# Patient Record
Sex: Male | Born: 1955 | Race: White | Hispanic: No | Marital: Married | Smoking: Former smoker
Health system: Southern US, Community
[De-identification: ages and names within clinical notes are randomized; demographics above are authoritative.]

## PROBLEM LIST (undated history)

## (undated) DIAGNOSIS — E079 Disorder of thyroid, unspecified: Secondary | ICD-10-CM

## (undated) DIAGNOSIS — E7800 Pure hypercholesterolemia, unspecified: Secondary | ICD-10-CM

## (undated) DIAGNOSIS — M069 Rheumatoid arthritis, unspecified: Secondary | ICD-10-CM

## (undated) DIAGNOSIS — Z87898 Personal history of other specified conditions: Secondary | ICD-10-CM

## (undated) DIAGNOSIS — I10 Essential (primary) hypertension: Secondary | ICD-10-CM

## (undated) DIAGNOSIS — E119 Type 2 diabetes mellitus without complications: Secondary | ICD-10-CM

## (undated) HISTORY — PX: CHOLECYSTECTOMY: SHX55

---

## 2016-10-03 ENCOUNTER — Emergency Department (HOSPITAL_COMMUNITY): Payer: Medicare Other

## 2016-10-03 ENCOUNTER — Observation Stay (HOSPITAL_COMMUNITY)
Admission: EM | Admit: 2016-10-03 | Discharge: 2016-10-04 | Disposition: A | Discharge status: Home / Self Care | Payer: Medicare Other | Attending: Internal Medicine | Admitting: Internal Medicine

## 2016-10-03 ENCOUNTER — Encounter (HOSPITAL_COMMUNITY): Payer: Self-pay | Admitting: Emergency Medicine

## 2016-10-03 DIAGNOSIS — R0789 Other chest pain: Secondary | ICD-10-CM | POA: Diagnosis not present

## 2016-10-03 DIAGNOSIS — Z79899 Other long term (current) drug therapy: Secondary | ICD-10-CM | POA: Insufficient documentation

## 2016-10-03 DIAGNOSIS — E7800 Pure hypercholesterolemia, unspecified: Secondary | ICD-10-CM | POA: Diagnosis not present

## 2016-10-03 DIAGNOSIS — G40909 Epilepsy, unspecified, not intractable, without status epilepticus: Secondary | ICD-10-CM | POA: Diagnosis not present

## 2016-10-03 DIAGNOSIS — I4581 Long QT syndrome: Secondary | ICD-10-CM | POA: Diagnosis not present

## 2016-10-03 DIAGNOSIS — Z87891 Personal history of nicotine dependence: Secondary | ICD-10-CM | POA: Diagnosis not present

## 2016-10-03 DIAGNOSIS — Z880 Allergy status to penicillin: Secondary | ICD-10-CM | POA: Insufficient documentation

## 2016-10-03 DIAGNOSIS — I10 Essential (primary) hypertension: Secondary | ICD-10-CM | POA: Diagnosis not present

## 2016-10-03 DIAGNOSIS — Z791 Long term (current) use of non-steroidal anti-inflammatories (NSAID): Secondary | ICD-10-CM | POA: Diagnosis not present

## 2016-10-03 DIAGNOSIS — E079 Disorder of thyroid, unspecified: Secondary | ICD-10-CM

## 2016-10-03 DIAGNOSIS — R079 Chest pain, unspecified: Secondary | ICD-10-CM | POA: Diagnosis present

## 2016-10-03 DIAGNOSIS — Z8249 Family history of ischemic heart disease and other diseases of the circulatory system: Secondary | ICD-10-CM | POA: Insufficient documentation

## 2016-10-03 DIAGNOSIS — Z87898 Personal history of other specified conditions: Secondary | ICD-10-CM

## 2016-10-03 DIAGNOSIS — M069 Rheumatoid arthritis, unspecified: Secondary | ICD-10-CM

## 2016-10-03 DIAGNOSIS — Z7982 Long term (current) use of aspirin: Secondary | ICD-10-CM | POA: Diagnosis not present

## 2016-10-03 DIAGNOSIS — E785 Hyperlipidemia, unspecified: Secondary | ICD-10-CM | POA: Diagnosis not present

## 2016-10-03 DIAGNOSIS — R7303 Prediabetes: Secondary | ICD-10-CM | POA: Diagnosis not present

## 2016-10-03 DIAGNOSIS — R0602 Shortness of breath: Secondary | ICD-10-CM

## 2016-10-03 HISTORY — DX: Pure hypercholesterolemia, unspecified: E78.00

## 2016-10-03 HISTORY — DX: Personal history of other specified conditions: Z87.898

## 2016-10-03 HISTORY — DX: Essential (primary) hypertension: I10

## 2016-10-03 HISTORY — DX: Rheumatoid arthritis, unspecified: M06.9

## 2016-10-03 HISTORY — DX: Type 2 diabetes mellitus without complications: E11.9

## 2016-10-03 HISTORY — DX: Disorder of thyroid, unspecified: E07.9

## 2016-10-03 LAB — COMPREHENSIVE METABOLIC PANEL
ALT: 45 U/L (ref 17–63)
AST: 39 U/L (ref 15–41)
Albumin: 4.5 g/dL (ref 3.5–5.0)
Alkaline Phosphatase: 113 U/L (ref 38–126)
Anion gap: 12 (ref 5–15)
BILIRUBIN TOTAL: 0.7 mg/dL (ref 0.3–1.2)
BUN: 15 mg/dL (ref 6–20)
CHLORIDE: 100 mmol/L — AB (ref 101–111)
CO2: 23 mmol/L (ref 22–32)
CREATININE: 1.37 mg/dL — AB (ref 0.61–1.24)
Calcium: 9.4 mg/dL (ref 8.9–10.3)
GFR, EST NON AFRICAN AMERICAN: 53 mL/min — AB (ref >60)
Glucose, Bld: 125 mg/dL — ABNORMAL HIGH (ref 65–99)
Potassium: 3.9 mmol/L (ref 3.5–5.1)
Sodium: 135 mmol/L (ref 135–145)
TOTAL PROTEIN: 7.2 g/dL (ref 6.5–8.1)

## 2016-10-03 LAB — CBC WITH DIFFERENTIAL/PLATELET
Basophils Absolute: 0 10*3/uL (ref 0.0–0.1)
Basophils Relative: 0 %
EOS PCT: 2 %
Eosinophils Absolute: 0.2 10*3/uL (ref 0.0–0.7)
HEMATOCRIT: 45.3 % (ref 39.0–52.0)
Hemoglobin: 16 g/dL (ref 13.0–17.0)
LYMPHS ABS: 3.8 10*3/uL (ref 0.7–4.0)
LYMPHS PCT: 33 %
MCH: 34.1 pg — AB (ref 26.0–34.0)
MCHC: 35.3 g/dL (ref 30.0–36.0)
MCV: 96.6 fL (ref 78.0–100.0)
MONO ABS: 0.9 10*3/uL (ref 0.1–1.0)
Monocytes Relative: 8 %
Neutro Abs: 6.5 10*3/uL (ref 1.7–7.7)
Neutrophils Relative %: 57 %
PLATELETS: 179 10*3/uL (ref 150–400)
RBC: 4.69 MIL/uL (ref 4.22–5.81)
RDW: 12.4 % (ref 11.5–15.5)
WBC: 11.5 10*3/uL — ABNORMAL HIGH (ref 4.0–10.5)

## 2016-10-03 LAB — TROPONIN I

## 2016-10-03 LAB — BRAIN NATRIURETIC PEPTIDE: B Natriuretic Peptide: 16.3 pg/mL (ref 0.0–100.0)

## 2016-10-03 LAB — I-STAT TROPONIN, ED: TROPONIN I, POC: 0 ng/mL (ref 0.00–0.08)

## 2016-10-03 MED ORDER — IOPAMIDOL (ISOVUE-370) INJECTION 76%
INTRAVENOUS | Status: AC
  Administered 2016-10-03: 100 mL
  Filled 2016-10-03: qty 100

## 2016-10-03 MED ORDER — TRAMADOL HCL 50 MG PO TABS
50.0000 mg | ORAL_TABLET | Freq: Two times a day (BID) | ORAL | Status: DC
  Administered 2016-10-03 – 2016-10-04 (×3): 50 mg via ORAL
  Filled 2016-10-03 (×3): qty 1

## 2016-10-03 MED ORDER — IPRATROPIUM-ALBUTEROL 0.5-2.5 (3) MG/3ML IN SOLN
3.0000 mL | Freq: Once | RESPIRATORY_TRACT | Status: AC
  Administered 2016-10-03: 3 mL via RESPIRATORY_TRACT
  Filled 2016-10-03: qty 3

## 2016-10-03 MED ORDER — CARBAMAZEPINE 100 MG PO CHEW
300.0000 mg | CHEWABLE_TABLET | Freq: Two times a day (BID) | ORAL | Status: DC
  Administered 2016-10-03 – 2016-10-04 (×3): 300 mg via ORAL
  Filled 2016-10-03 (×4): qty 3

## 2016-10-03 MED ORDER — LEVOTHYROXINE SODIUM 25 MCG PO TABS
25.0000 ug | ORAL_TABLET | ORAL | Status: DC
  Administered 2016-10-03 – 2016-10-04 (×2): 25 ug via ORAL
  Filled 2016-10-03 (×2): qty 1

## 2016-10-03 MED ORDER — ENOXAPARIN SODIUM 40 MG/0.4ML ~~LOC~~ SOLN
40.0000 mg | SUBCUTANEOUS | Status: DC
  Administered 2016-10-03: 40 mg via SUBCUTANEOUS
  Filled 2016-10-03: qty 0.4

## 2016-10-03 MED ORDER — ASPIRIN 81 MG PO CHEW
324.0000 mg | CHEWABLE_TABLET | Freq: Once | ORAL | Status: AC
  Administered 2016-10-03: 324 mg via ORAL
  Filled 2016-10-03: qty 4

## 2016-10-03 MED ORDER — NITROGLYCERIN 2 % TD OINT
1.0000 [in_us] | TOPICAL_OINTMENT | Freq: Once | TRANSDERMAL | Status: AC
  Administered 2016-10-03: 1 [in_us] via TOPICAL
  Filled 2016-10-03: qty 1

## 2016-10-03 MED ORDER — CARVEDILOL 6.25 MG PO TABS
6.2500 mg | ORAL_TABLET | Freq: Two times a day (BID) | ORAL | Status: DC
  Administered 2016-10-03 – 2016-10-04 (×3): 6.25 mg via ORAL
  Filled 2016-10-03 (×3): qty 1

## 2016-10-03 MED ORDER — HYDROXYCHLOROQUINE SULFATE 200 MG PO TABS
200.0000 mg | ORAL_TABLET | Freq: Two times a day (BID) | ORAL | Status: DC
  Administered 2016-10-03 – 2016-10-04 (×3): 200 mg via ORAL
  Filled 2016-10-03 (×4): qty 1

## 2016-10-03 MED ORDER — ACETAMINOPHEN 325 MG PO TABS: 650.0000 mg | ORAL_TABLET | Freq: Four times a day (QID) | ORAL | Status: DC | PRN

## 2016-10-03 MED ORDER — PREDNISONE 20 MG PO TABS
60.0000 mg | ORAL_TABLET | Freq: Once | ORAL | Status: AC
  Administered 2016-10-03: 60 mg via ORAL
  Filled 2016-10-03: qty 3

## 2016-10-03 MED ORDER — ASPIRIN EC 81 MG PO TBEC
81.0000 mg | DELAYED_RELEASE_TABLET | Freq: Every day | ORAL | Status: DC
  Administered 2016-10-04: 81 mg via ORAL
  Filled 2016-10-03: qty 1

## 2016-10-03 MED ORDER — LORATADINE 10 MG PO TABS
10.0000 mg | ORAL_TABLET | Freq: Every day | ORAL | Status: DC
  Administered 2016-10-03 – 2016-10-04 (×2): 10 mg via ORAL
  Filled 2016-10-03 (×2): qty 1

## 2016-10-03 MED ORDER — ACETAMINOPHEN 650 MG RE SUPP: 650.0000 mg | Freq: Four times a day (QID) | RECTAL | Status: DC | PRN

## 2016-10-03 MED ORDER — ATORVASTATIN CALCIUM 40 MG PO TABS
40.0000 mg | ORAL_TABLET | Freq: Every day | ORAL | Status: DC
  Administered 2016-10-03: 40 mg via ORAL
  Filled 2016-10-03: qty 1

## 2016-10-03 MED ORDER — SODIUM CHLORIDE 0.9% FLUSH
3.0000 mL | Freq: Two times a day (BID) | INTRAVENOUS | Status: DC
  Administered 2016-10-03 – 2016-10-04 (×2): 3 mL via INTRAVENOUS

## 2016-10-03 NOTE — ED Notes (Signed)
Patient transported to CT 

## 2016-10-03 NOTE — ED Provider Notes (Signed)
[facility]PT Provider Note   CSN: [id number] Arrival date & time: 10/03/16  0301     History   Chief Complaint Chief Complaint  Patient presents with  . Shortness of Breath    HPI Joseph Long is a 61 y.o. male.  The history is provided by the patient.  Shortness of Breath  This is a recurrent problem. The average episode lasts 6 hours. The problem occurs continuously.The current episode started 6 to 12 hours ago. The problem has been gradually improving. Associated symptoms include rhinorrhea, cough, orthopnea and chest pain. Pertinent negatives include no fever, no sore throat, no sputum production, no hemoptysis and no vomiting. It is unknown what precipitated the problem. He has tried nothing for the symptoms. Associated medical issues do not include pneumonia, PE, heart failure or past MI.  Chest Pain   This is a recurrent problem. The current episode started 3 to 5 hours ago. The problem occurs constantly. The problem has not changed since onset.The pain is present in the substernal region. The pain is mild. The quality of the pain is described as pressure-like and dull. The pain radiates to the left shoulder. Associated symptoms include cough, orthopnea and shortness of breath. Pertinent negatives include no fever, no hemoptysis, no sputum production and no vomiting. Risk factors include smoking/tobacco exposure, being elderly and male gender.  His past medical history is significant for diabetes and hypertension.  Pertinent negatives for past medical history include no MI and no PE.   Had cardiac work up last year including negative stress testing in [facility] in [place], [place].   Past Medical History:  Diagnosis Date  . Diabetes mellitus without complication (HCC)   . History of seizures   . Hypercholesterolemia   . Hypertension   . Rheumatoid arthritis (HCC)   . Thyroid disease     Patient Active Problem List   Diagnosis Date Noted  . Chest pain 10/03/2016   . History of seizures 10/03/2016  . Hypercholesterolemia 10/03/2016  . Hypertension 10/03/2016  . Rheumatoid arthritis (HCC) 10/03/2016  . Thyroid disease 10/03/2016  . Pre-diabetes 10/03/2016    Past Surgical History:  Procedure Laterality Date  . CHOLECYSTECTOMY         Home Medications    Prior to Admission medications   Medication Sig Start Date End Date Taking? Authorizing Provider  aspirin EC 81 MG tablet Take 81 mg by mouth daily.   Yes [provider]  atorvastatin (LIPITOR) 40 MG tablet Take 40 mg by mouth at bedtime. 09/21/16  Yes [provider]  carbamazepine (TEGRETOL) 100 MG chewable tablet Chew 300 mg by mouth 2 (two) times daily. 09/15/16  Yes [provider]  carvedilol (COREG) 6.25 MG tablet Take 6.25 mg by mouth 2 (two) times daily. 07/06/16  Yes [provider]  Cholecalciferol (D3-1000 PO) Take 2,000 Units by mouth every morning.   Yes [provider]  folic acid (FOLVITE) 800 MCG tablet Take 800 mcg by mouth every morning.   Yes [provider]  hydroxychloroquine (PLAQUENIL) 200 MG tablet Take 200 mg by mouth 2 (two) times daily. 09/15/16  Yes [provider]  latanoprost (XALATAN) 0.005 % ophthalmic solution Place 1 drop into both eyes daily. 07/03/16  Yes [provider]  levothyroxine (SYNTHROID, LEVOTHROID) 25 MCG tablet Take 25 mcg by mouth every morning. 09/15/16  Yes [provider]  loratadine (CLARITIN) 10 MG tablet Take 10 mg by mouth daily.   Yes [provider]  meloxicam (  MOBIC) 15 MG tablet Take 15 mg by mouth daily. 09/17/16  Yes [provider]  Multiple Vitamin (MULTIVITAMIN WITH MINERALS) TABS tablet Take 1 tablet by mouth every morning.   Yes [provider]  Omega-3 Fatty Acids (FISH OIL) 1000 MG CAPS Take 1,000 mg by mouth 2 (two) times daily.   Yes [provider]  traMADol (ULTRAM) 50 MG tablet Take 50 mg by mouth 2 (two) times  daily. 07/06/16  Yes [provider]    Family History Family History  Problem Relation Age of Onset  . COPD Mother   . Hyperlipidemia Father   . Heart attack Father   . Colon cancer Sister   . Diabetes Maternal Grandmother     Social History Social History  Substance Use Topics  . Smoking status: Former Smoker    Packs/day: 1.00    Years: 20.00  . Smokeless tobacco: Never Used     Comment: 1 PPD for 20 years  . Alcohol use Yes     Comment: Occasional     Allergies   Penicillins   Review of Systems Review of Systems  Constitutional: Negative for fever.  HENT: Positive for rhinorrhea. Negative for sore throat.   Respiratory: Positive for cough and shortness of breath. Negative for hemoptysis and sputum production.   Cardiovascular: Positive for chest pain and orthopnea.  Gastrointestinal: Negative for vomiting.  All other systems are reviewed and are negative for acute change except as noted in the HPI    Physical Exam Updated Vital Signs BP 135/79   Pulse 68   Temp 98.1 F (36.7 C)   Resp 16   Ht 5\' 4"  (1.626 m)   Wt 84.8 kg (187 lb)   SpO2 100%   BMI 32.10 kg/m   Physical Exam  Constitutional: He is oriented to person, place, and time. He appears well-developed and well-nourished. No distress.  HENT:  Head: Normocephalic and atraumatic.  Nose: Nose normal.  Eyes: Pupils are equal, round, and reactive to light. Conjunctivae and EOM are normal. Right eye exhibits no discharge. Left eye exhibits no discharge. No scleral icterus.  Neck: Normal range of motion. Neck supple.  Cardiovascular: Normal rate and regular rhythm.  Exam reveals no gallop and no friction rub.   No murmur heard. Pulmonary/Chest: Effort normal and breath sounds normal. No stridor. No respiratory distress. He has no rales.  Abdominal: Soft. He exhibits no distension. There is no tenderness.  Musculoskeletal: He exhibits no edema or tenderness.  NO SWELLING OR EDEMA    Neurological: He is alert and oriented to person, place, and time.  Skin: Skin is warm and dry. No rash noted. He is not diaphoretic. No erythema.  Psychiatric: He has a normal mood and affect.  Vitals reviewed.    ED Treatments / Results  Labs (all labs ordered are listed, but only abnormal results are displayed) Labs Reviewed  CBC WITH DIFFERENTIAL/PLATELET - Abnormal; Notable for the following:       Result Value   WBC 11.5 (*)    MCH 34.1 (*)    All other components within normal limits  COMPREHENSIVE METABOLIC PANEL - Abnormal; Notable for the following:    Chloride 100 (*)    Glucose, Bld 125 (*)    Creatinine, Ser 1.37 (*)    GFR calc non Af Amer 53 (*)    All other components within normal limits  BRAIN NATRIURETIC PEPTIDE  TROPONIN I  HIV ANTIBODY (ROUTINE TESTING)  TROPONIN I  HEMOGLOBIN A1C  BASIC METABOLIC PANEL  CBC  I-STAT TROPONIN, ED    EKG  EKG Interpretation  Date/Time:  Friday October 03 2016 03:09:02 EDT Ventricular Rate:  73 PR Interval:  182 QRS Duration: 92 QT Interval:  440 QTC Calculation: 484 R Axis:   22 Text Interpretation:  Sinus rhythm with occasional Premature ventricular complexes Nonspecific ST abnormality Prolonged QT Abnormal ECG NO STEMI No old tracing to compare Confirmed by Drema Pry [phone]) on 10/03/2016 8:20:45 AM       Radiology Dg Chest 2 View  Result Date: 10/03/2016 CLINICAL DATA:  Shortness of breath tonight, chest discomfort. EXAM: CHEST  2 VIEW COMPARISON:  None. FINDINGS: Cardiomediastinal silhouette is normal . Mild interstitial prominence. Increased lung volumes with flattened hemidiaphragms. No pleural effusion or focal consolidation. Linear density LEFT lung base. No pneumothorax. Soft tissue planes and included osseous structures are nonsuspicious. Surgical clips in the included right abdomen compatible with cholecystectomy. Mild degenerative change of the spine. IMPRESSION: COPD.  LEFT lung base  atelectasis/scarring. Electronically Signed   By: Awilda Metro M.D.   On: 10/03/2016 03:58   Ct Angio Chest Pe W And/or Wo Contrast  Result Date: 10/03/2016 CLINICAL DATA:  61 year old male with acute onset mid chest pain/ tightness this morning. EXAM: CT ANGIOGRAPHY CHEST WITH CONTRAST TECHNIQUE: Multidetector CT imaging of the chest was performed using the standard protocol during bolus administration of intravenous contrast. Multiplanar CT image reconstructions and MIPs were obtained to evaluate the vascular anatomy. CONTRAST:  100 mL Isovue 370. COMPARISON:  None. FINDINGS: Cardiovascular: Satisfactory opacification of the pulmonary arteries to the segmental level. No evidence of pulmonary embolism. Normal heart size. No pericardial effusion. Mediastinum/Nodes: No enlarged mediastinal, hilar, or axillary lymph nodes. Thyroid gland, trachea, and esophagus demonstrate no significant findings. Lungs/Pleura: Lungs are clear. There is minimal dependent atelectasis. No pleural effusion or pneumothorax. Upper Abdomen: No acute abnormality. Patient is status post cholecystectomy. Partially visualized right renal cyst demonstrates fluid attenuation. Musculoskeletal: No chest wall abnormality. No acute or significant osseous findings. There are degenerative changes of the thoracolumbar spine with anterior osteophytosis. Review of the MIP images confirms the above findings. IMPRESSION: No CT evidence for pulmonary embolism or other acute intrathoracic findings to explain the patient's clinical symptoms. Electronically Signed   By: Sande Brothers M.D.   On: 10/03/2016 10:33    Procedures Procedures (including critical care time)  Medications Ordered in ED Medications  enoxaparin (LOVENOX) injection 40 mg (not administered)  sodium chloride flush (NS) 0.9 % injection 3 mL (3 mLs Intravenous Not Given 10/03/16 1215)  acetaminophen (TYLENOL) tablet 650 mg (not administered)    Or  acetaminophen (TYLENOL)  suppository 650 mg (not administered)  aspirin EC tablet 81 mg (not administered)  atorvastatin (LIPITOR) tablet 40 mg (not administered)  carbamazepine (TEGRETOL) chewable tablet 300 mg (300 mg Oral Given 10/03/16 1318)  carvedilol (COREG) tablet 6.25 mg (6.25 mg Oral Given 10/03/16 1317)  hydroxychloroquine (PLAQUENIL) tablet 200 mg (200 mg Oral Given 10/03/16 1319)  levothyroxine (SYNTHROID, LEVOTHROID) tablet 25 mcg (not administered)  loratadine (CLARITIN) tablet 10 mg (10 mg Oral Given 10/03/16 1317)  traMADol (ULTRAM) tablet 50 mg (50 mg Oral Given 10/03/16 1318)  ipratropium-albuterol (DUONEB) 0.5-2.5 (3) MG/3ML nebulizer solution 3 mL (3 mLs Nebulization Given 10/03/16 0905)  aspirin chewable tablet 324 mg (324 mg Oral Given 10/03/16 0932)  nitroGLYCERIN (NITROGLYN) 2 % ointment 1 inch (1 inch Topical Given 10/03/16 0932)  iopamidol (ISOVUE-370) 76 % injection (100 mLs  Contrast Given 10/03/16 1017)  ipratropium-albuterol (DUONEB) 0.5-2.5 (3) MG/3ML nebulizer solution 3 mL (3 mLs Nebulization Given 10/03/16 1122)  predniSONE (DELTASONE) tablet 60 mg (60 mg Oral Given 10/03/16 1151)     Initial Impression / Assessment and Plan / ED Course  I have reviewed the triage vital signs and the nursing notes.  Pertinent labs & imaging results that were available during my care of the patient were reviewed by me and considered in my medical decision making (see chart for details).     Atypical chest pain. EKG with nonspecific changes. No prior EKG for comparison. Initial troponin negative. Chest pain improved with nitroglycerin. Patient has a HEART >3 and will require admission for ACS rule out and the rest of the workup is negative.  Given the patient's recent travel and focal atelectasis noted on chest x-ray, CTA was obtained ruling out PE, pneumonia.  Presentation was not classic for aortic dissection or esophageal perforation.  Case is discussed with medicine who admitted the patient for  further workup and management.  Final Clinical Impressions(s) / ED Diagnoses   Final diagnoses:  Shortness of breath  Atypical chest pain      Clancy Leiner, Amadeo Garnet, MD 10/03/16 1722

## 2016-10-03 NOTE — ED Triage Notes (Signed)
Patient reports SOB with occasional dry cough this morning , denies fever or chills .

## 2016-10-03 NOTE — H&P (Signed)
Date: 10/03/2016               Patient Name:  Joseph Long MRN: [id number]  DOB: 1955-09-04 Age / Sex: 61 y.o., male   PCP: Dr. Renaldo Fiddler. [facility], [place]         Medical Service: Internal Medicine Teaching Service         Attending Physician: Dr. Doneen Poisson, MD    First Contact: Dr. Minda Meo Pager: [phone]  Second Contact: Dr. Earlene Plater Pager: [phone]       After Hours (After 5p/  First Contact Pager: [phone]  weekends / holidays): Second Contact Pager: [phone]   Chief Complaint: Chest Pain and shortness of breath  History of Present Illness:  Joseph Long is a 61 year old male with PMH of HTN, HLD, Pre-Diabetes, Rheumatoid Arthritis, Hypothyroidism, Hx of Seizures, and Former Tobacco Use who presents with chest pain and shortness of breath. Patient states he was in his usual state of health until last night when he laid down to sleep. He had new-onset shortness of breath with left sided pressure-like chest pain, 7-8/10 in intensity, with radiation to his left back. His chest pain did not radiate down his arms or to his neck or jaw. He had associated dry non-productive cough and rhinorrhea. He denies any inciting event such as increased activity or heavy lifting. He denies any diaphoresis, palpitations, lightheadedness, dizziness, fevers, chills, headache, change in vision, nausea, vomiting, peripheral edema, numbness, tingling, or focal weakness. He did not fall or lose consciousness.  He reports a similar episode last year at which time he was seen at [facility] in [place], [place]. He says he had two stress tests (including nuclear stress) as well as an Echocardiogram. He says the cardiologist he saw could not find anything wrong with his heart. His previous records are not available in EPIC. He says he has otherwise felt fine since then and normally completes all his ADLs without issue. He did recently travel from [place], driving back to [place]/ with his  son. He denies any prior history of blood clots.  In the ED, initial vitals were: BP 161/91, Pulse 69, Temp 97.7 F, RR 16, SpO2 100% on RA  Lab work was notable for WBC 11.5, Creatinine 1.37 (unknown baseline), BNP 16, I-stat Troponin 0.00. He was given ASA 324 mg and Nitroglycerin ointment with improvement in his pain (2-3/10 when seen). A CXR was read as COPD with left lung base atelectasis/scarring. A CTA chest was obtained which was negative for PE. He was given Duoneb treatments x 2 and Prednisone 60 mg once. IMTS were called to admit for chest pain rule out.   Meds:  Current Meds  Medication Sig  . aspirin EC 81 MG tablet Take 81 mg by mouth daily.  Marland Kitchen atorvastatin (LIPITOR) 40 MG tablet Take 40 mg by mouth at bedtime.  . carbamazepine (TEGRETOL) 100 MG chewable tablet Chew 300 mg by mouth 2 (two) times daily.  . carvedilol (COREG) 6.25 MG tablet Take 6.25 mg by mouth 2 (two) times daily.  . Cholecalciferol (D3-1000 PO) Take 2,000 Units by mouth every morning.  . folic acid (FOLVITE) 800 MCG tablet Take 800 mcg by mouth every morning.  . hydroxychloroquine (PLAQUENIL) 200 MG tablet Take 200 mg by mouth 2 (two) times daily.  Marland Kitchen latanoprost (XALATAN) 0.005 % ophthalmic solution Place 1 drop into both eyes daily.  Marland Kitchen levothyroxine (SYNTHROID, LEVOTHROID) 25 MCG tablet Take 25 mcg by mouth every  morning.  . loratadine (CLARITIN) 10 MG tablet Take 10 mg by mouth daily.  . meloxicam (MOBIC) 15 MG tablet Take 15 mg by mouth daily.  . Multiple Vitamin (MULTIVITAMIN WITH MINERALS) TABS tablet Take 1 tablet by mouth every morning.  . Omega-3 Fatty Acids (FISH OIL) 1000 MG CAPS Take 1,000 mg by mouth 2 (two) times daily.  . traMADol (ULTRAM) 50 MG tablet Take 50 mg by mouth 2 (two) times daily.     Allergies: Allergies as of 10/03/2016 - Review Complete 10/03/2016  Allergen Reaction Noted  . Penicillins  10/03/2016   Past Medical History:  Diagnosis Date  . Diabetes mellitus without  complication (HCC)   . History of seizures   . Hypercholesterolemia   . Hypertension   . Rheumatoid arthritis (HCC)   . Thyroid disease     Family History:  Family History  Problem Relation Age of Onset  . COPD Mother   . Hyperlipidemia Father   . Heart attack Father   . Colon cancer Sister   . Diabetes Maternal Grandmother      Social History:  Social History   Social History  . Marital status: Married    Spouse name: N/A  . Number of children: N/A  . Years of education: N/A   Social History Main Topics  . Smoking status: Former Smoker    Packs/day: 1.00    Years: 20.00  . Smokeless tobacco: Never Used     Comment: 1 PPD for 20 years  . Alcohol use Yes     Comment: Occasional  . Drug use: No  . Sexual activity: Not Asked   Other Topics Concern  . None   Social History Narrative   Lives in [place], [place]     Review of Systems: A complete ROS was negative except as per HPI.   Physical Exam: Blood pressure 116/72, pulse 78, temperature 98.1 F (36.7 C), resp. rate 12, height 5\' 4"  (1.626 m), weight 187 lb (84.8 kg), SpO2 98 %. Physical Exam  Constitutional: He is oriented to person, place, and time. He appears well-developed and well-nourished. No distress.  HENT:  Head: Normocephalic and atraumatic.  Mouth/Throat: Oropharynx is clear and moist.  Eyes: EOM are normal.  Cardiovascular: Normal rate, regular rhythm and intact distal pulses.   No murmur heard. Distant heart sounds  Pulmonary/Chest: Effort normal. No respiratory distress. He has no wheezes. He has no rales.  Abdominal: Soft. He exhibits no distension. There is no tenderness. There is no guarding.  Musculoskeletal: Normal range of motion. He exhibits no edema or tenderness.  Neurological: He is alert and oriented to person, place, and time.  Skin: Skin is warm. He is not diaphoretic.  Psychiatric: He has a normal mood and affect.     EKG: personally reviewed my interpretation is Sinus  Rhythm, Rate 72, PVC, low voltage leads III, aVF, V1, no acute ischemic changes, no prior for comparison.  CXR: personally reviewed my interpretation is no effusion or consolidation, flattened hemidiaphragm  Assessment & Plan by Problem: Principal Problem:   Chest pain Active Problems:   History of seizures   Hypercholesterolemia   Hypertension   Rheumatoid arthritis (HCC)   Thyroid disease   Pre-diabetes  61 year old male with PMH of HTN, HLD, Pre-Diabetes, Rheumatoid Arthritis, Hypothyroidism, Hx of Seizures, and Former Tobacco Use who presented with chest pain and shortness of breath  Chest Pain: Symptoms are typical in nature. EKG is without acute ischemic changes and point-of-care cardiac  enzymes are negative so far. Per patient report, prior cardiac workup last year was negative. These records are not available in EPIC, I have called [facility] to fax records to [place]. Will admit for chest pain rule out and trend troponins and monitor on telemetry.  - Trend Troponins - Repeat EKG in AM - NTG prn - ASA 81 mg daily - Continue home Carvedilol 6.25 mg BID, Atorvastatin 40 mg daily - Prior records pending  HTN: Stable, continue home Coreg as above.  Rheumatoid Arthritis: Currently stable. Follows with Rheumatology in [place]. - Continue home Plaquenil 200 mg BID - Continue home Tramadol 50 mg po BID  HLD: Continue Atorvastatin 40 mg daily  Hypothyroidism: Continue home Levothyroxine 25 mcg daily  Pre-Diabetes: Diet controlled. Will check Hgb A1c.  Hx of Seizures: Patient has not had any seizures for man years. Will continue home Carbamazepine 300 mg po BID.  CODE: FULL CODE   Dispo: Admit patient to Observation with expected length of stay less than 2 midnights.  Signed: Darreld Mclean, MD 10/03/2016, 1:20 PM

## 2016-10-04 DIAGNOSIS — Z8669 Personal history of other diseases of the nervous system and sense organs: Secondary | ICD-10-CM

## 2016-10-04 DIAGNOSIS — R0789 Other chest pain: Secondary | ICD-10-CM | POA: Diagnosis not present

## 2016-10-04 DIAGNOSIS — E079 Disorder of thyroid, unspecified: Secondary | ICD-10-CM

## 2016-10-04 DIAGNOSIS — I10 Essential (primary) hypertension: Secondary | ICD-10-CM | POA: Diagnosis not present

## 2016-10-04 DIAGNOSIS — R0602 Shortness of breath: Secondary | ICD-10-CM | POA: Diagnosis not present

## 2016-10-04 DIAGNOSIS — K219 Gastro-esophageal reflux disease without esophagitis: Secondary | ICD-10-CM | POA: Diagnosis not present

## 2016-10-04 DIAGNOSIS — E7800 Pure hypercholesterolemia, unspecified: Secondary | ICD-10-CM | POA: Diagnosis not present

## 2016-10-04 DIAGNOSIS — Z9049 Acquired absence of other specified parts of digestive tract: Secondary | ICD-10-CM

## 2016-10-04 DIAGNOSIS — R7303 Prediabetes: Secondary | ICD-10-CM | POA: Diagnosis not present

## 2016-10-04 DIAGNOSIS — Z880 Allergy status to penicillin: Secondary | ICD-10-CM

## 2016-10-04 LAB — BASIC METABOLIC PANEL WITH GFR
Anion gap: 9 (ref 5–15)
BUN: 21 mg/dL — ABNORMAL HIGH (ref 6–20)
CO2: 21 mmol/L — ABNORMAL LOW (ref 22–32)
Calcium: 8.8 mg/dL — ABNORMAL LOW (ref 8.9–10.3)
Chloride: 103 mmol/L (ref 101–111)
Creatinine, Ser: 1.37 mg/dL — ABNORMAL HIGH (ref 0.61–1.24)
GFR calc Af Amer: >60 mL/min
GFR calc non Af Amer: 53 mL/min — ABNORMAL LOW
Glucose, Bld: 128 mg/dL — ABNORMAL HIGH (ref 65–99)
Potassium: 4.2 mmol/L (ref 3.5–5.1)
Sodium: 133 mmol/L — ABNORMAL LOW (ref 135–145)

## 2016-10-04 LAB — HIV ANTIBODY (ROUTINE TESTING W REFLEX): HIV SCREEN 4TH GENERATION: NONREACTIVE

## 2016-10-04 LAB — CBC
HCT: 39.9 % (ref 39.0–52.0)
Hemoglobin: 13.9 g/dL (ref 13.0–17.0)
MCH: 33.5 pg (ref 26.0–34.0)
MCHC: 34.8 g/dL (ref 30.0–36.0)
MCV: 96.1 fL (ref 78.0–100.0)
Platelets: 152 K/uL (ref 150–400)
RBC: 4.15 MIL/uL — ABNORMAL LOW (ref 4.22–5.81)
RDW: 12.8 % (ref 11.5–15.5)
WBC: 7.9 K/uL (ref 4.0–10.5)

## 2016-10-04 LAB — HEMOGLOBIN A1C
Hgb A1c MFr Bld: 6.7 % — ABNORMAL HIGH (ref 4.8–5.6)
Mean Plasma Glucose: 145.59 mg/dL

## 2016-10-04 LAB — TROPONIN I: Troponin I: <0.03 ng/mL

## 2016-10-04 MED ORDER — PANTOPRAZOLE SODIUM 20 MG PO TBEC
20.0000 mg | DELAYED_RELEASE_TABLET | Freq: Every day | ORAL | Status: DC
  Administered 2016-10-04: 20 mg via ORAL
  Filled 2016-10-04: qty 1

## 2016-10-04 MED ORDER — PANTOPRAZOLE SODIUM 20 MG PO TBEC: 20.0000 mg | DELAYED_RELEASE_TABLET | Freq: Every day | ORAL | 1 refills | Status: AC

## 2016-10-04 MED ORDER — PANTOPRAZOLE SODIUM 40 MG PO TBEC: 40.0000 mg | DELAYED_RELEASE_TABLET | Freq: Every day | ORAL | Status: DC

## 2016-10-04 NOTE — H&P (Signed)
Internal Medicine Attending Admission Note Date: 10/04/2016  Patient name: Joseph Long Medical record number: [id number] Date of birth: [birth year] Age: 61 y.o. Gender: male  I saw and evaluated the patient. I reviewed the resident's note and I agree with the resident's findings and plan as documented in the resident's note.  Chief Complaint(s): Chest pain and shortness of breath.  History - key components related to admission:  Joseph Long is a 61 year old man with a history of hypertension, hyperlipidemia, prior tobacco abuse, prediabetes although the hemoglobin A1c today is 6.7%, rheumatoid arthritis, hypothyroidism, and seizure disorder who presents with the acute onset of chest pain and shortness of breath. He traveled from the [place] area to visit [place], [place] and his sister-in-law. He was in his usual state of health until he laid down last night to go to sleep when he noted the acute onset of chest pain and shortness of breath. The chest pain was left-sided and radiated to his left back. He had no nausea, diaphoresis, or dizziness. It was associated with shortness of breath and he has had a recent nonproductive cough and rhinorrhea.  He had a similar episode one year ago and was seen in [place], [place]. At that time he underwent a dobutamine stress echocardiogram which was negative. This report was faxed to [place] and reviewed.  When seen on rounds the morning after admission he was without any chest pain or new symptoms since admission.  Physical Exam - key components related to admission:  Vitals:   10/03/16 2007 10/04/16 0057 10/04/16 0636 10/04/16 0819  BP: 108/71 116/67 121/77 140/78  Pulse: 83 78 76 68  Resp: 20 20 20 18   Temp: 97.8 F (36.6 C) 97.9 F (36.6 C) 98.7 F (37.1 C) 97.6 F (36.4 C)  TempSrc: Oral Oral Oral Oral  SpO2: 98% 95% 98% 98%  Weight:   180 lb 6.4 oz (81.8 kg)   Height:       Gen.: Well-developed, well-nourished, man sitting comfortably in  bed in no acute distress. Lungs: Clear to auscultation bilaterally without wheezes, rhonchi, rales. Heart: Regular rate and rhythm without murmurs, rubs, or gallops. Extremities: Without edema.  Lab results:  Basic Metabolic Panel:  Recent Labs  0329 10/04/16 0103  NA 135 133*  K 3.9 4.2  CL 100* 103  CO2 23 21*  GLUCOSE 125* 128*  BUN 15 21*  CREATININE 1.37* 1.37*  CALCIUM 9.4 8.8*   Liver Function Tests:  Recent Labs  10/03/16 0329  AST 39  ALT 45  ALKPHOS 113  BILITOT 0.7  PROT 7.2  ALBUMIN 4.5   CBC:  Recent Labs  10/03/16 0329 10/04/16 0103  WBC 11.5* 7.9  NEUTROABS 6.5  --   HGB 16.0 13.9  HCT 45.3 39.9  MCV 96.6 96.1  PLT 179 152   Cardiac Enzymes:  Recent Labs  10/03/16 1555 10/04/16 0103  TROPONINI <0.03 <0.03   Hemoglobin A1C:  Recent Labs  10/04/16 0103  HGBA1C 6.7*   Misc. Labs:  BNP 16.3  Imaging results:   Chest x-ray: Personally reviewed. Barrel chest. No effusions, infiltrates, or masses. No comparisons available.  Thoracic CT angiogram: Personally reviewed. No pulmonary embolism, shoddy azygous lymph node, no pulmonary nodules, masses, effusions, or infiltrates, aortic atherosclerosis present, clips in the gallbladder fossa suggesting prior cholecystectomy.  Other results:  EKG: Normal sinus rhythm at 73 bpm, 1 premature ventricular contraction, normal axis, normal intervals, no significant Q waves, no LVH by voltage, delayed R wave progression,  no ST or T-wave changes. No comparisons immediately available.  Assessment & Plan by Problem:  Joseph Long is a 61 year old man with a history of hypertension, hyperlipidemia, prior tobacco abuse, prediabetes, rheumatoid arthritis, hypothyroidism, and seizure disorder who presents with the acute onset of chest pain and shortness of breath. Although he has several risk factors for coronary artery disease the pain is atypical in that it occurred with lying down and has not  occurred with exertion. He has ruled out for myocardial infarction with serial enzymes. This is in the setting of a negative dobutamine echocardiography examination one year ago where he was deemed low risk. He does not have a gallbladder and therefore cholecystitis or cholelithiasis is unlikely the cause of this pain. He does have gastroesophageal reflux disease for which he takes an as needed medication. This could result in some esophageal disease including esophagitis or spasm. This can be further assessed in the outpatient setting. Finally, given his extensive recent travel, pulmonary embolism was a reasonable thought but has been ruled out with a CT angiogram.  1) Atypical chest pain: He has ruled out for myocardial infarction with serial enzymes and a pulmonary embolism with a CT angiogram. The cause of his chest pain and shortness of breath are unknown, but since they occurred when he laid down I am concerned about acid reflux causing esophageal spasm or irritation. He will be started on Protonix upon discharge. We discussed the importance of following up with his primary care provider for further evaluation.  2) Prediabetes: He reportedly carries a diagnosis of prediabetes although his hemoglobin A1c today was 6.7%. This will have to be repeated one additional time before he can be labeled a diabetic. Further evaluation for a diagnosis of diabetes will be done in [place] with his primary care provider.  3) Disposition: He will be discharged home today.

## 2016-10-04 NOTE — Progress Notes (Signed)
   Subjective: Patient seen and examined. He states he had no chest pain over night and symptoms have resolved. Patient denies any symptoms of dysphagia or food getting stick in his throat. He denies regurgitation of food. He does have heartburn and reflux symptoms at times, but manages those symptoms with OTC antacids. He denies current chest pain or shortness of breath. Dicussed with the patient that his work up had ruled out PE and MI as cause of his chest pain, and that follow up with his PCP is highly suggested to determine source of pain.   Objective:  Vital signs in last 24 hours: Vitals:   10/03/16 2007 10/04/16 0057 10/04/16 0636 10/04/16 0819  BP: 108/71 116/67 121/77 140/78  Pulse: 83 78 76 68  Resp: 20 20 20 18   Temp: 97.8 F (36.6 C) 97.9 F (36.6 C) 98.7 F (37.1 C) 97.6 F (36.4 C)  TempSrc: Oral Oral Oral Oral  SpO2: 98% 95% 98% 98%  Weight:   180 lb 6.4 oz (81.8 kg)   Height:       Physical Exam  Constitutional: He is oriented to person, place, and time. He appears well-developed and well-nourished. No distress.  HENT:  Head: Normocephalic and atraumatic.  Eyes: Conjunctivae and EOM are normal.  Neck: Normal range of motion. Neck supple.  Cardiovascular: Normal rate, regular rhythm and normal heart sounds.  Exam reveals no gallop and no friction rub.   No murmur heard. Pulmonary/Chest: Effort normal and breath sounds normal.  CTAB   Neurological: He is alert and oriented to person, place, and time.  Skin: He is not diaphoretic.    Assessment/Plan:  Principal Problem:   Chest pain Active Problems:   History of seizures   Hypercholesterolemia   Hypertension   Rheumatoid arthritis (HCC)   Thyroid disease   Pre-diabetes   Chest Pain: Not caused by PE or MI. CTA was normal, EKG without acute ischemic changes and Troponin <0.03 serially. Records from 11/2015 showd normal ECHO with LVEF 55-60%, with normal wall motion, as well as low risk stress test with  no evidence of stress induced ischemia.  -Discharge today  - ASA 81 mg daily - Continue home Carvedilol 6.25 mg BID, Atorvastatin 40 mg daily -Start Protoxin 40 mg  HTN: Stable, continue home Coreg as above.  Rheumatoid Arthritis: - Continue home Plaquenil 200 mg BID - Continue home Tramadol 50 mg po BID  HLD: Continue Atorvastatin 40 mg daily  Hypothyroidism: Continue home Levothyroxine 25 mcg daily  Pre-Diabetes: Diet controlled, A1C 6.7.   Hx of Seizures: -Carbamazepine 300 mg po BID.  Dispo: Anticipated discharge today.   12/2015, MD 10/04/2016, 11:28 AM Pager: [phone]

## 2016-10-04 NOTE — Discharge Summary (Signed)
Name: Joseph Long MRN: [id number] DOB: 1955/03/03 61 y.o. PCP: System, Pcp Not In  Date of Admission: 10/03/2016  7:33 AM Date of Discharge: 10/04/2016 Attending Physician: Doneen Poisson, MD  Discharge Diagnosis: 1. Chest pain, non cardiac in origin, possibly GI related Principal Problem:   Chest pain Active Problems:   History of seizures   Hypercholesterolemia   Hypertension   Rheumatoid arthritis (HCC)   Thyroid disease   Pre-diabetes   Discharge Medications: Allergies as of 10/04/2016      Reactions   Penicillins    Has patient had a PCN reaction causing immediate rash, facial/tongue/throat swelling, SOB or lightheadedness with hypotension: UNK Has patient had a PCN reaction causing severe rash involving mucus membranes or skin necrosis: UNK Has patient had a PCN reaction that required hospitalization: UNK Has patient had a PCN reaction occurring within the last 10 years: NO If all of the above answers are "NO", then may proceed with Cephalosporin use.      Medication List    TAKE these medications   aspirin EC 81 MG tablet Take 81 mg by mouth daily.   atorvastatin 40 MG tablet Commonly known as:  LIPITOR Take 40 mg by mouth at bedtime.   carbamazepine 100 MG chewable tablet Commonly known as:  TEGRETOL Chew 300 mg by mouth 2 (two) times daily.   carvedilol 6.25 MG tablet Commonly known as:  COREG Take 6.25 mg by mouth 2 (two) times daily.   D3-1000 PO Take 2,000 Units by mouth every morning.   Fish Oil 1000 MG Caps Take 1,000 mg by mouth 2 (two) times daily.   folic acid 800 MCG tablet Commonly known as:  FOLVITE Take 800 mcg by mouth every morning.   hydroxychloroquine 200 MG tablet Commonly known as:  PLAQUENIL Take 200 mg by mouth 2 (two) times daily.   latanoprost 0.005 % ophthalmic solution Commonly known as:  XALATAN Place 1 drop into both eyes daily.   levothyroxine 25 MCG tablet Commonly known as:  SYNTHROID, LEVOTHROID Take 25 mcg  by mouth every morning.   loratadine 10 MG tablet Commonly known as:  CLARITIN Take 10 mg by mouth daily.   meloxicam 15 MG tablet Commonly known as:  MOBIC Take 15 mg by mouth daily.   multivitamin with minerals Tabs tablet Take 1 tablet by mouth every morning.   pantoprazole 20 MG tablet Commonly known as:  PROTONIX Take 1 tablet (20 mg total) by mouth daily.   traMADol 50 MG tablet Commonly known as:  ULTRAM Take 50 mg by mouth 2 (two) times daily.       Disposition and follow-up:   Mr.Steele Friedlander was discharged from [facility] in Good condition.  At the hospital follow up visit please address:  1.  Chest pain, non-cardiac in origin, New medication: Protonix, GI work up  2.  Labs / imaging needed at time of follow-up: Barium swallow, stress test  3.  Pending labs/ test needing follow-up: none  Follow-up Appointments:   Hospital Course by problem list: Principal Problem:   Chest pain Active Problems:   History of seizures   Hypercholesterolemia   Hypertension   Rheumatoid arthritis (HCC)   Thyroid disease   Pre-diabetes   1. Chest pain, non cardiac in origin, possibly GI related Mr. Scheibel was admitted to [facility] and the Internal Medicine teaching service for chest pain. In the emergency department, he was given 324 mg of Aspirin and Nitroglycerin ointment which improved  his pain. EKG showed normal sinus rhythm with no acute ischemic changes. A CT angiogram of the chest was performed, which showed no eveidence of pulmonary embolism or other acute intrathoracic findings to explain the patient's clinical symptoms. Troponin levels were trended and were serially negative, <0.03.   With cardiopulmonary work up negative, his pain may be gastrointestinal in origin. He was discharged with a prescription for Protonix 20 mg daily. He was discharged from the hospital stable without any reoccurrence of chest pain and encourage to make a follow up  appointment with his primary care physician.   Discharge Vitals:   BP 140/78 (BP Location: Right Arm)   Pulse 68   Temp 97.6 F (36.4 C) (Oral)   Resp 18   Ht 5\' 4"  (1.626 m)   Wt 180 lb 6.4 oz (81.8 kg)   SpO2 98%   BMI 30.97 kg/m   Pertinent Labs, Studies, and Procedures:  BMP Latest Ref Rng & Units 10/04/2016 10/03/2016  Glucose 65 - 99 mg/dL 10/05/2016) 992(E)  BUN 6 - 20 mg/dL 268(T) 15  Creatinine 41(D - 1.24 mg/dL 6.22) 2.97(L)  Sodium 135 - 145 mmol/L 133(L) 135  Potassium 3.5 - 5.1 mmol/L 4.2 3.9  Chloride 101 - 111 mmol/L 103 100(L)  CO2 22 - 32 mmol/L 21(L) 23  Calcium 8.9 - 10.3 mg/dL 8.92(J) 9.4   CBC Latest Ref Rng & Units 10/04/2016 10/03/2016  WBC 4.0 - 10.5 K/uL 7.9 11.5(H)  Hemoglobin 13.0 - 17.0 g/dL 10/05/2016 17.4  Hematocrit 08.1 - 52.0 % 39.9 45.3  Platelets 150 - 400 K/uL 152 179   CT Angio Chest IMPRESSION: No CT evidence for pulmonary embolism or other acute intrathoracic findings to explain the patient's clinical symptoms.  Chest Xray FINDINGS: Cardiomediastinal silhouette is normal . Mild interstitial prominence. Increased lung volumes with flattened hemidiaphragms. No pleural effusion or focal consolidation. Linear density LEFT lung base. No pneumothorax. Soft tissue planes and included osseous structures are nonsuspicious. Surgical clips in the included right abdomen compatible with cholecystectomy. Mild degenerative change of the spine.  Discharge Instructions: Discharge Instructions    Call MD for:  difficulty breathing, headache or visual disturbances    Complete by:  As directed    Call MD for:  severe uncontrolled pain    Complete by:  As directed    Diet - low sodium heart healthy    Complete by:  As directed    Discharge instructions    Complete by:  As directed    Mr. Boomhower,  You were admitted for chest pain. You did not have a heart attack or pulmonary embolism. We did multiple tests for those conditions, which were all negative.   We  started you on a medication called Protonix. This will help reduce acid reflux. Please take 20 mg daily.   Please follow up with primary care physician within one week to discuss your hospitalization and the new medication were prescribed.   Increase activity slowly    Complete by:  As directed       Signed: Terrance Mass, MD 10/04/2016, 12:04 PM   Pager: [phone]

## 2016-10-04 NOTE — Progress Notes (Signed)
Call placed to [facility] to notify of telemetry monitoring d/c.   

## 2016-10-04 NOTE — Progress Notes (Signed)
Internal Medicine Attending  Date: 10/04/2016  Patient name: Joseph Long Medical record number: [id number] Date of birth: [birth year] Age: 61 y.o. Gender: male  I saw and evaluated the patient. I reviewed the resident's note by Dr. Minda Meo and I agree with the resident's findings and plans as documented in her progress note.  Please see my H&P dated 10/04/2016 for the specifics of my evaluation, assessment, and plan from earlier in the day.

## 2018-08-29 IMAGING — CT CT ANGIO CHEST
2 of 8 series · 19 of 36 positions shown · IV contrast (OMNI)
Comparison: None.

CLINICAL DATA: 61-year-old male with acute onset mid chest pain/
tightness this morning.

EXAM:
CT ANGIOGRAPHY CHEST WITH CONTRAST
TECHNIQUE: Multidetector CT imaging of the chest was performed using the
standard protocol during bolus administration of intravenous
contrast. Multiplanar CT image reconstructions and MIPs were
obtained to evaluate the vascular anatomy.
CONTRAST:  100 mL Isovue 370.

[Series 6: thins · axial · 0.71mm/px · z∈[+1091,+1375]mm · 18 of 318 slices shown]
[im 17/318  lung]
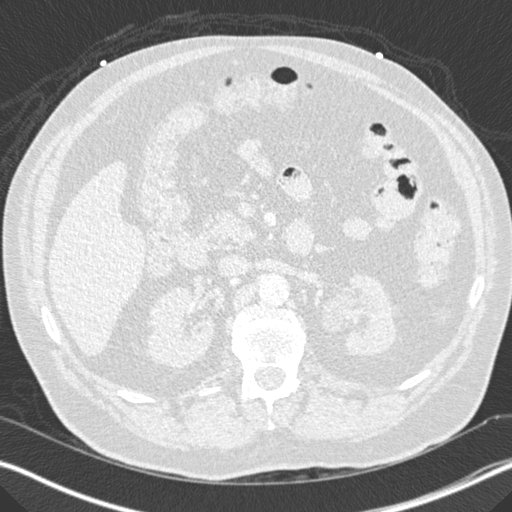
[im 34/318  mediastinal]
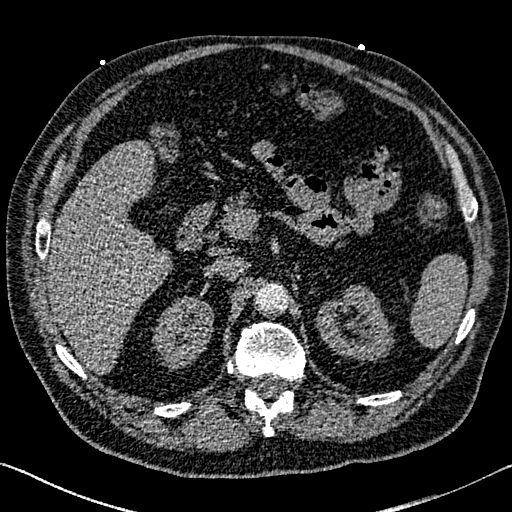
[im 51/318  lung]
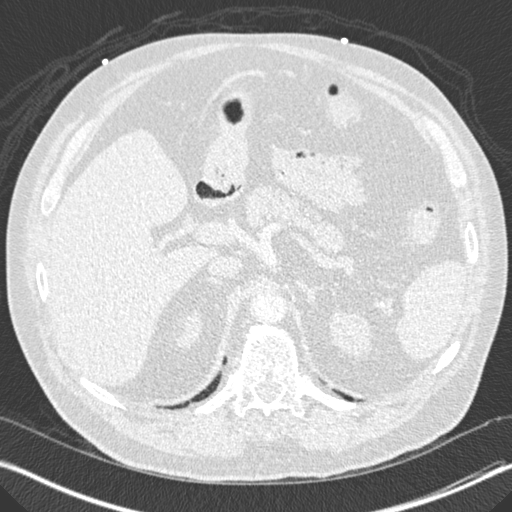
[im 67/318  mediastinal]
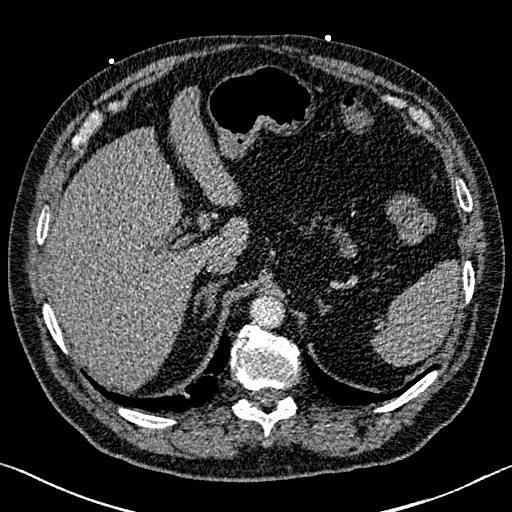
[im 84/318  lung]
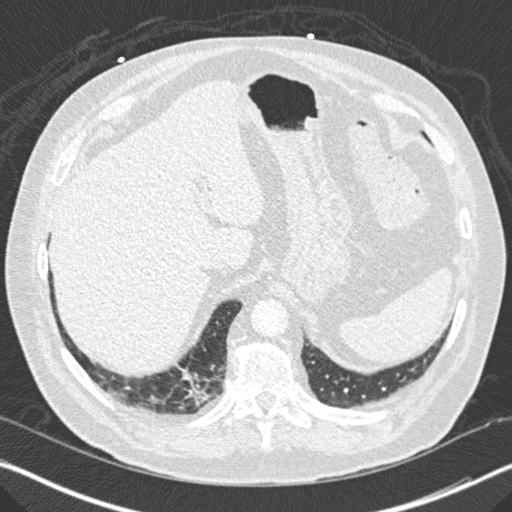
[im 101/318  mediastinal]
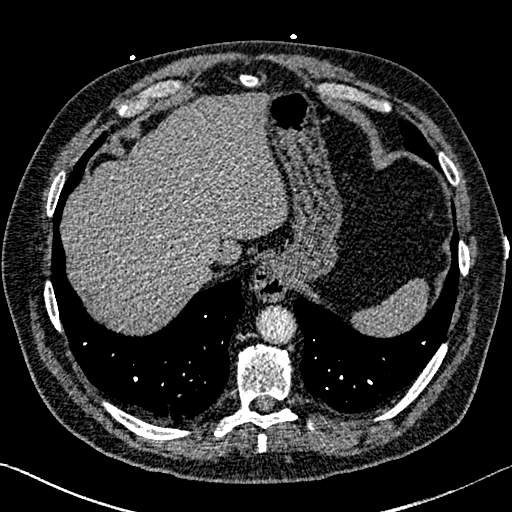
[im 117/318  lung]
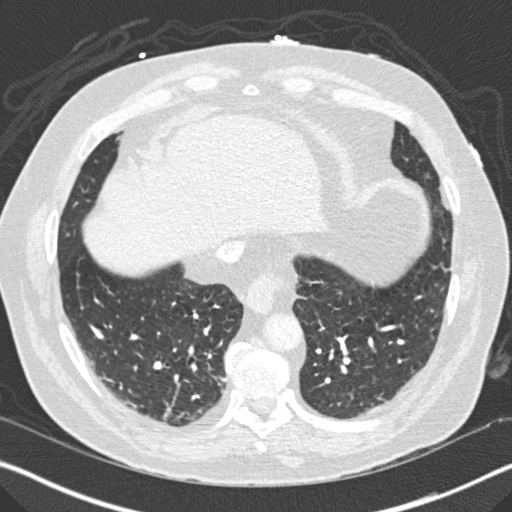
[im 134/318  mediastinal]
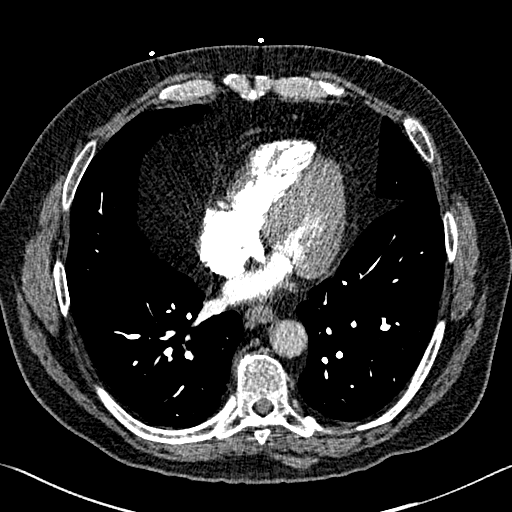
[im 151/318  lung]
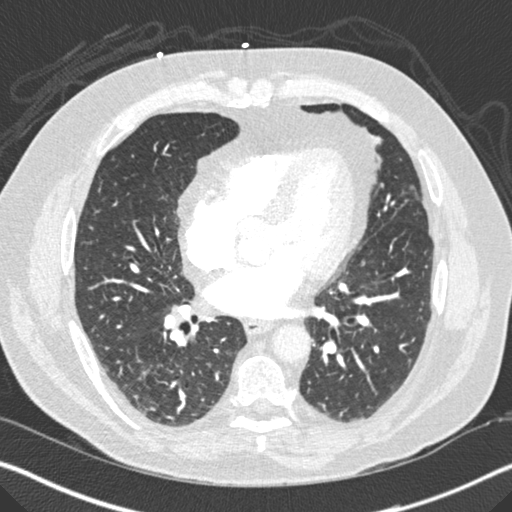
[im 167/318  mediastinal]
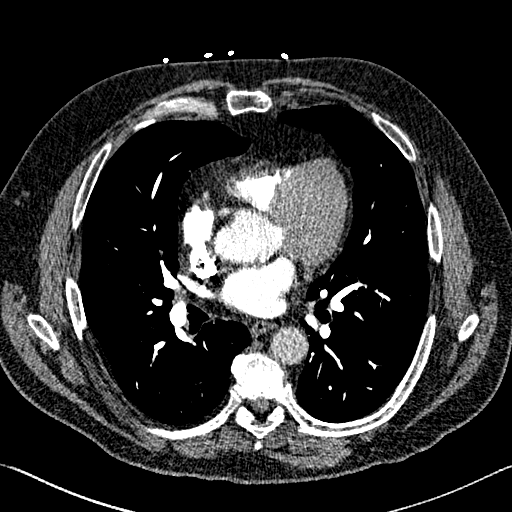
[im 184/318  lung]
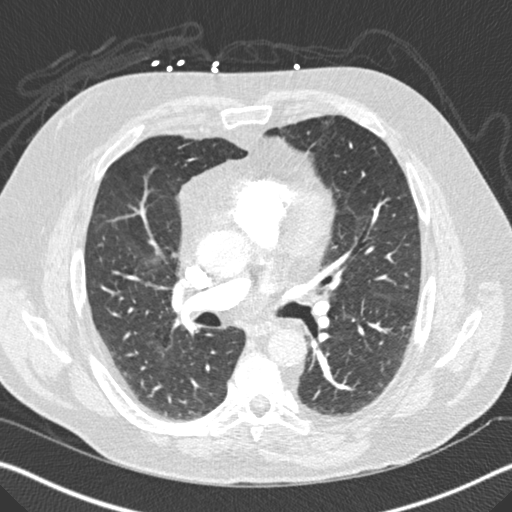
[im 201/318  mediastinal]
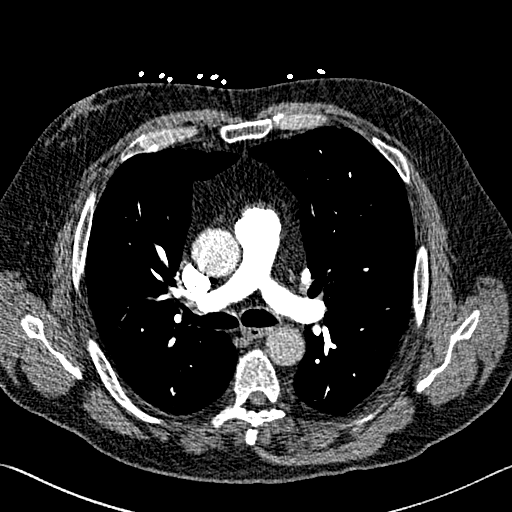
[im 217/318  lung]
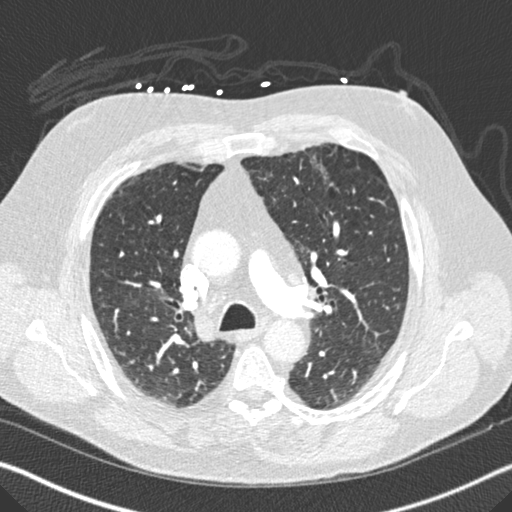
[im 234/318  mediastinal]
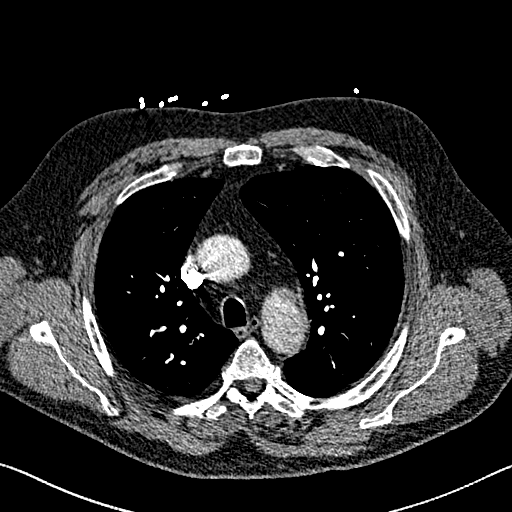
[im 251/318  lung]
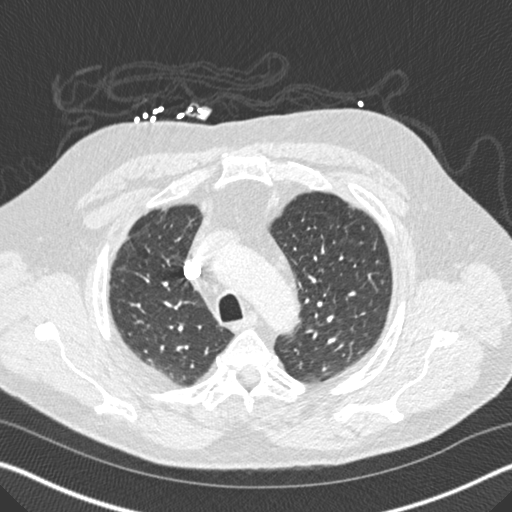
[im 267/318  mediastinal]
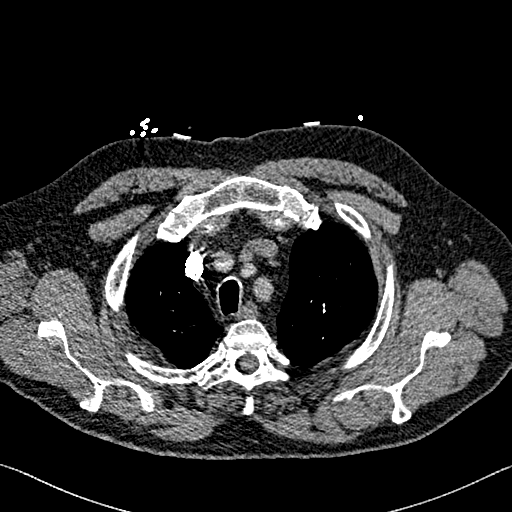
[im 284/318  lung]
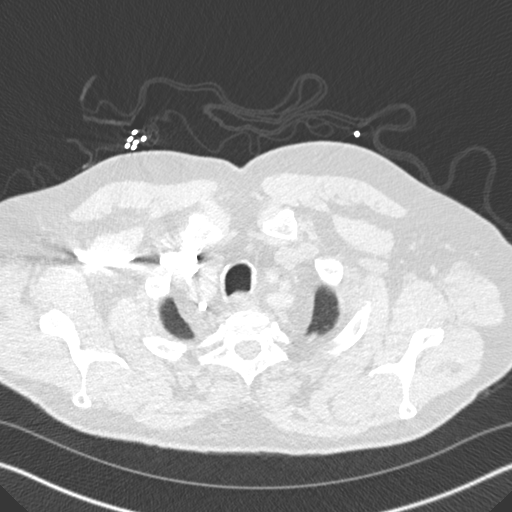
[im 301/318  mediastinal]
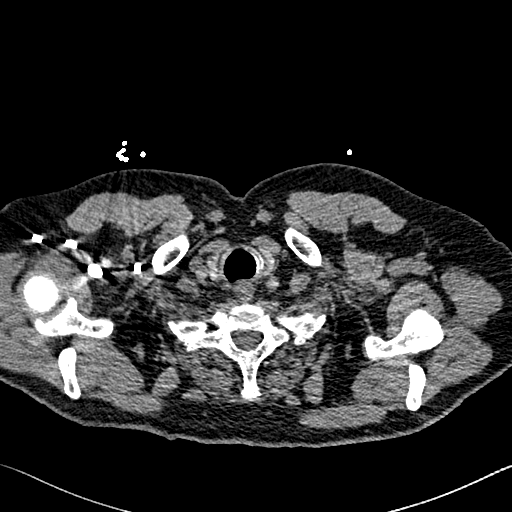

[Series 8: coronal mpr · coronal · 0.62mm/px · 1 of 141 slices shown]
[im 71/141  mediastinal]
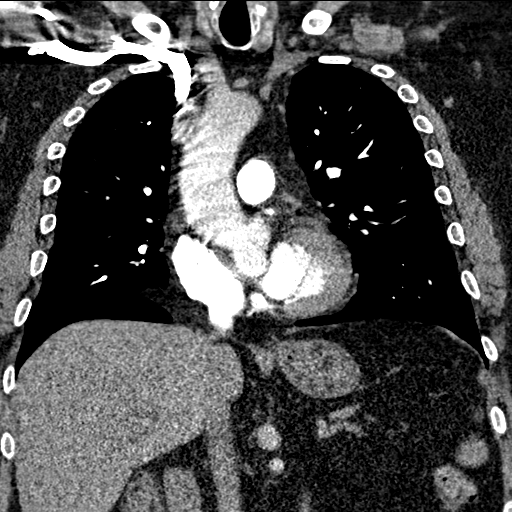

[19 of 36 positions shown; findings below may reference images not displayed]

FINDINGS: Cardiovascular: Satisfactory opacification of the pulmonary arteries
to the segmental level. No evidence of pulmonary embolism. Normal
heart size. No pericardial effusion.

Mediastinum/Nodes: No enlarged mediastinal, hilar, or axillary lymph
nodes. Thyroid gland, trachea, and esophagus demonstrate no
significant findings.

Lungs/Pleura: Lungs are clear. There is minimal dependent
atelectasis. No pleural effusion or pneumothorax.

Upper Abdomen: No acute abnormality. Patient is status post
cholecystectomy. Partially visualized right renal cyst demonstrates
fluid attenuation.

Musculoskeletal: No chest wall abnormality. No acute or significant
osseous findings. There are degenerative changes of the
thoracolumbar spine with anterior osteophytosis.

Review of the MIP images confirms the above findings.
IMPRESSION: No CT evidence for pulmonary embolism or other acute intrathoracic
findings to explain the patient's clinical symptoms.

## 2018-08-29 IMAGING — DX DG CHEST 2V
2 series · 2 of 2 positions shown · non-contrast
Comparison: None.

CLINICAL DATA: Shortness of breath tonight, chest discomfort.

EXAM:
CHEST  2 VIEW

[chest pa]
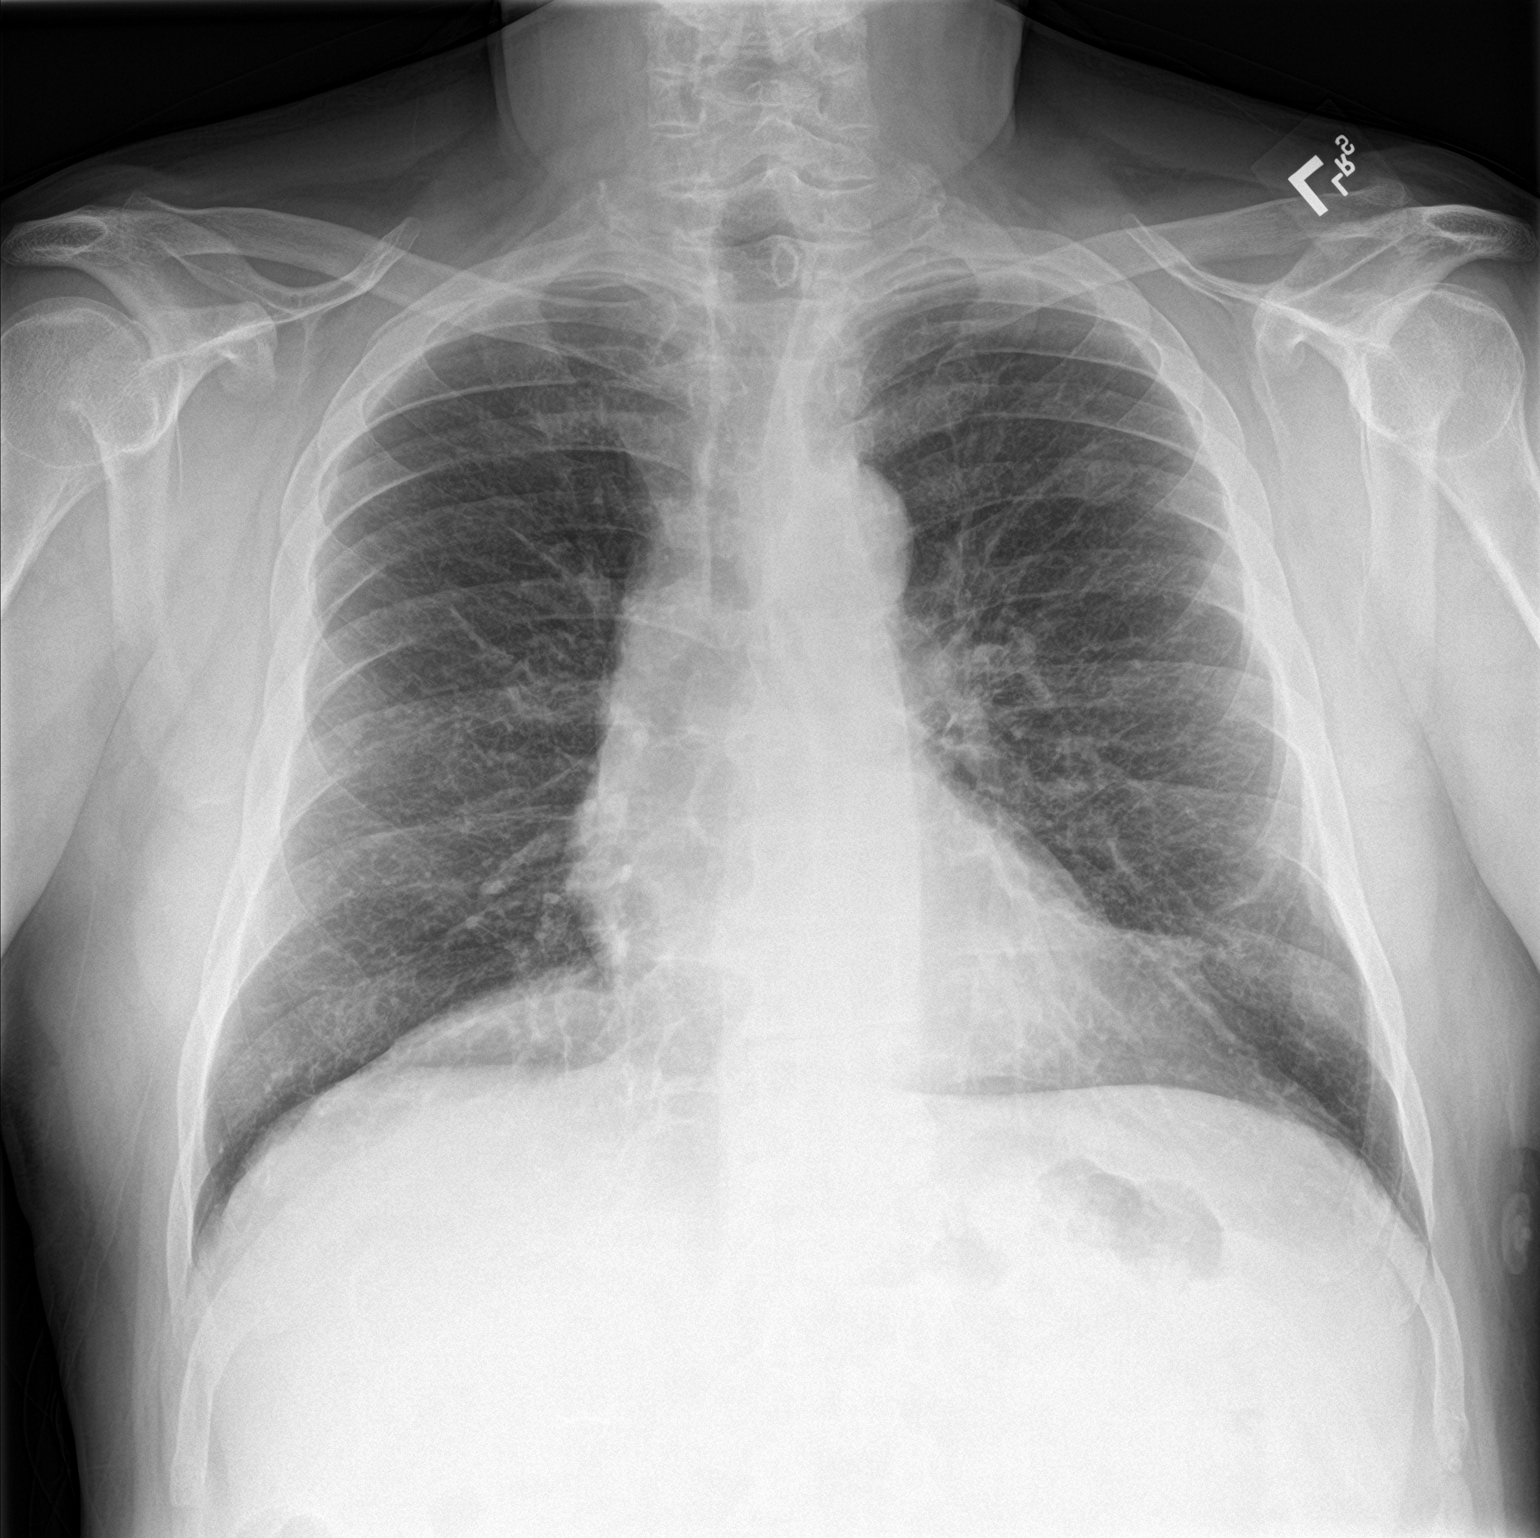

[chest lat]
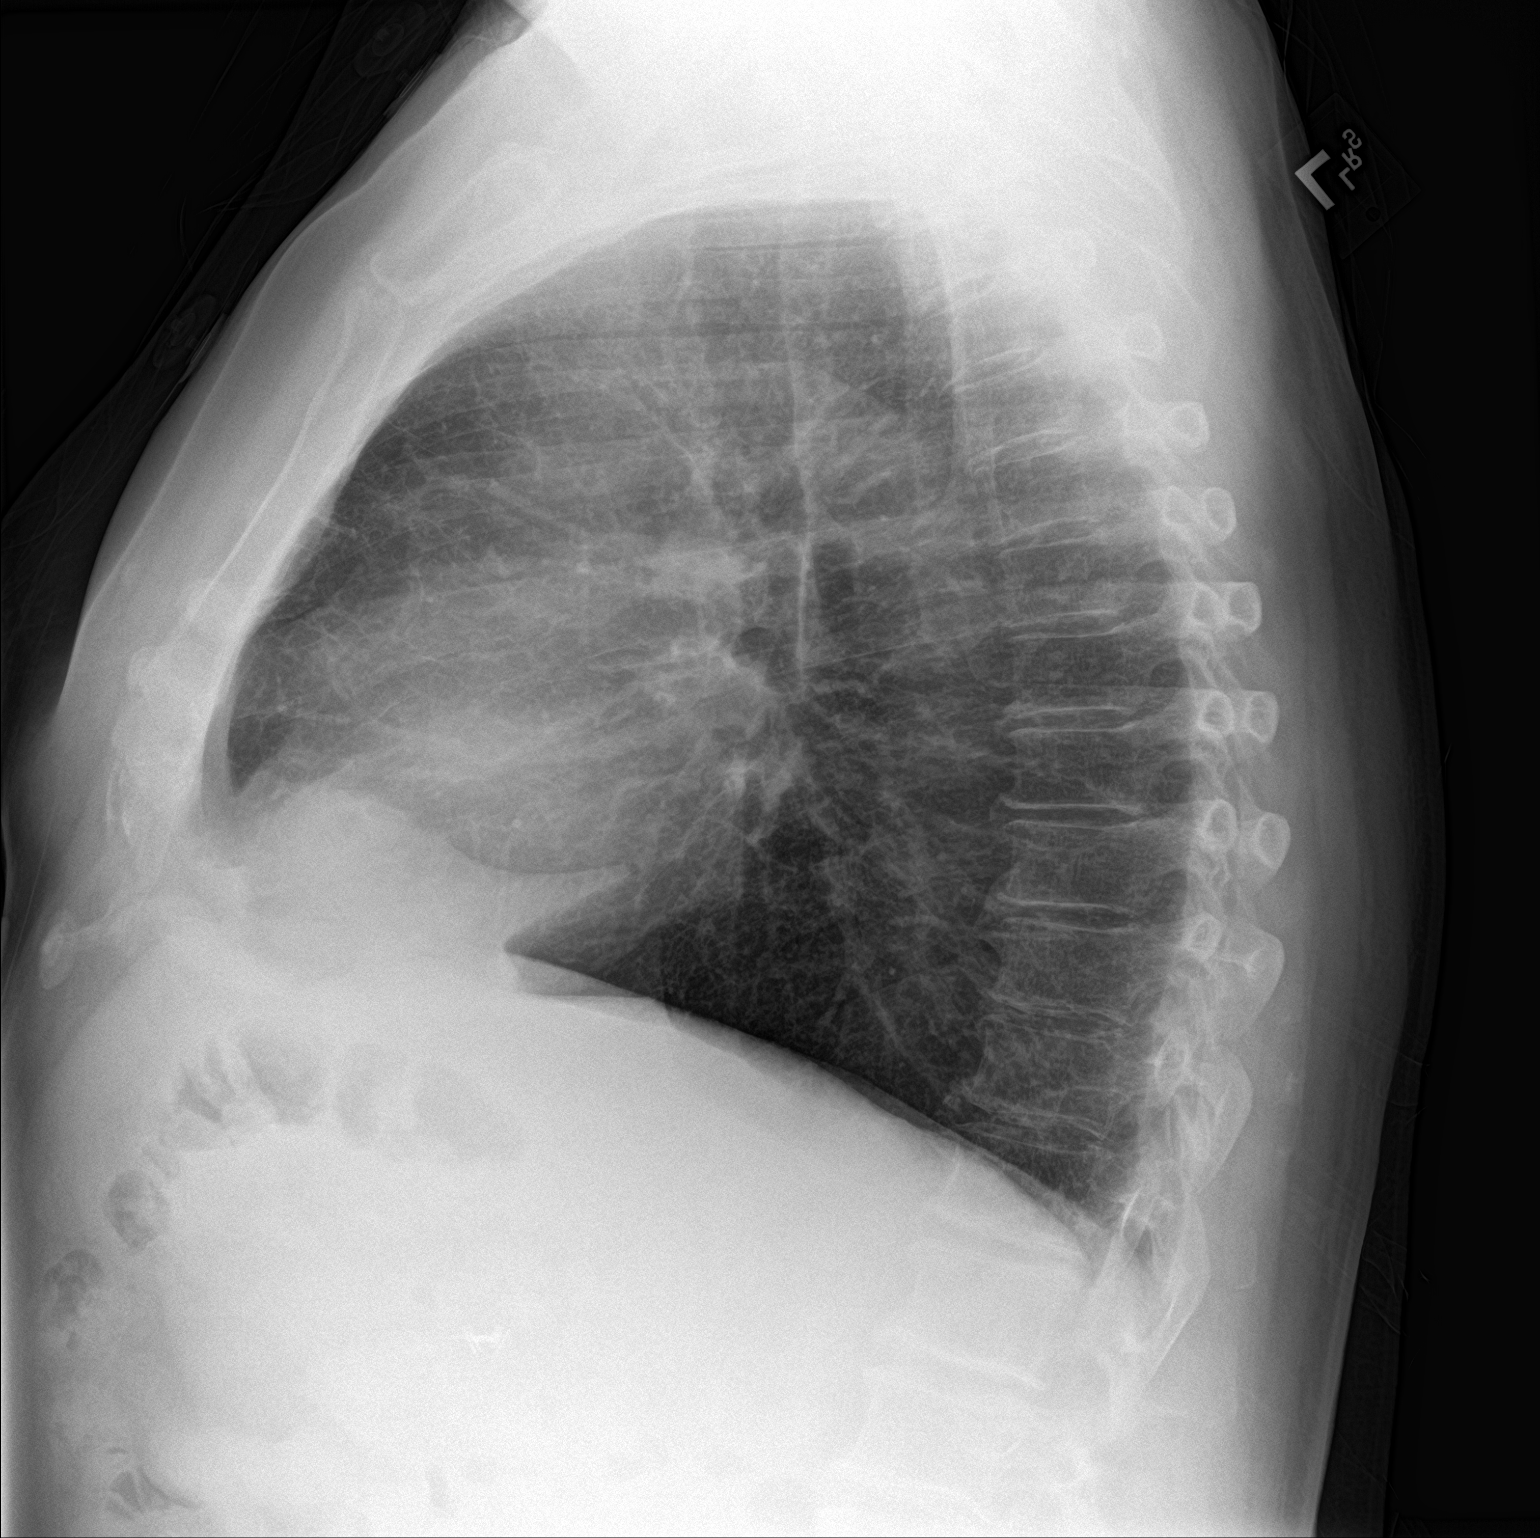

[2 of 2 positions shown; findings below may reference images not displayed]

FINDINGS: Cardiomediastinal silhouette is normal . Mild interstitial
prominence. Increased lung volumes with flattened hemidiaphragms. No
pleural effusion or focal consolidation. Linear density LEFT lung
base. No pneumothorax. Soft tissue planes and included osseous
structures are nonsuspicious. Surgical clips in the included right
abdomen compatible with cholecystectomy. Mild degenerative change of
the spine.
IMPRESSION: COPD.  LEFT lung base atelectasis/scarring.
